# Patient Record
Sex: Male | Born: 1980 | Race: White | Marital: Married | Smoking: Never smoker
Health system: Southern US, Community
[De-identification: ages and names within clinical notes are randomized; demographics above are authoritative.]

---

## 2017-08-07 ENCOUNTER — Ambulatory Visit
Admission: RE | Admit: 2017-08-07 | Discharge: 2017-08-07 | Disposition: A | Discharge status: Home / Self Care | Payer: No Typology Code available for payment source | Source: Ambulatory Visit | Attending: Occupational Medicine | Admitting: Occupational Medicine

## 2017-08-07 ENCOUNTER — Other Ambulatory Visit: Payer: Self-pay | Admitting: Occupational Medicine

## 2017-08-07 DIAGNOSIS — Z021 Encounter for pre-employment examination: Secondary | ICD-10-CM

## 2018-11-26 ENCOUNTER — Ambulatory Visit (HOSPITAL_COMMUNITY)
Admission: EM | Admit: 2018-11-26 | Discharge: 2018-11-26 | Disposition: A | Discharge status: Home / Self Care | Payer: 59 | Attending: Family Medicine | Admitting: Family Medicine

## 2018-11-26 ENCOUNTER — Other Ambulatory Visit: Payer: Self-pay

## 2018-11-26 ENCOUNTER — Encounter (HOSPITAL_COMMUNITY): Payer: Self-pay | Admitting: Emergency Medicine

## 2018-11-26 DIAGNOSIS — J029 Acute pharyngitis, unspecified: Secondary | ICD-10-CM | POA: Diagnosis not present

## 2018-11-26 DIAGNOSIS — Z20828 Contact with and (suspected) exposure to other viral communicable diseases: Secondary | ICD-10-CM | POA: Diagnosis not present

## 2018-11-26 LAB — POCT RAPID STREP A: Streptococcus, Group A Screen (Direct): NEGATIVE

## 2018-11-26 NOTE — ED Triage Notes (Signed)
Pt here with sore throat x 3 days 

## 2018-11-26 NOTE — Discharge Instructions (Addendum)
Your strep test was negative.  We will send for culture. Not convinced this is COVID but we will send a COVID swab and call you if your results are positive Most likely this is a common cold or allergies.  You can try over-the-counter cetirizine or Zyrtec for your symptoms.  You could do some salt water gargles or throat lozenges for your sore throat Follow up as needed for continued or worsening symptoms

## 2018-11-26 NOTE — ED Provider Notes (Signed)
[facility]    CSN: [id number] Arrival date & time: 11/26/18  1027      History   Chief Complaint Chief Complaint  Patient presents with  . Sore Throat    HPI Jorge Cooper is a 38 y.o. male.   Patient is an otherwise healthy male presents today with approximately 3 days of sore throat.  Somewhat worsening this morning with pain with swallowing.  Denies any trouble swallowing.  Symptoms have been constant.  He has not taken anything for his symptoms.  Denies any cough, chest congestion, fever, ear pain, body aches, chills.  Patient is a Emergency planning/management officerpolice officer.  Denies any known COVID exposures.  ROS per HPI      History reviewed. No pertinent past medical history.  There are no active problems to display for this patient.   History reviewed. No pertinent surgical history.     Home Medications    Prior to Admission medications   Not on File    Family History History reviewed. No pertinent family history.  Social History Social History   Tobacco Use  . Smoking status: Never Smoker  . Smokeless tobacco: Never Used  Substance Use Topics  . Alcohol use: Not Currently  . Drug use: Never     Allergies   Patient has no known allergies.   Review of Systems Review of Systems   Physical Exam Triage Vital Signs ED Triage Vitals [11/26/18 1111]  Enc Vitals Group     BP 137/77     Pulse Rate 83     Resp 18     Temp 98.5 F (36.9 C)     Temp Source Oral     SpO2 100 %     Weight      Height      Head Circumference      Peak Flow      Pain Score 5     Pain Loc      Pain Edu?      Excl. in GC?    No data found.  Updated Vital Signs BP 137/77 (BP Location: Left Arm)   Pulse 83   Temp 98.5 F (36.9 C) (Oral)   Resp 18   SpO2 100%   Visual Acuity Right Eye Distance:   Left Eye Distance:   Bilateral Distance:    Right Eye Near:   Left Eye Near:    Bilateral Near:     Physical Exam Vitals signs and nursing note reviewed.   Constitutional:      General: He is not in acute distress.    Appearance: He is well-developed. He is not ill-appearing, toxic-appearing or diaphoretic.  HENT:     Head: Normocephalic and atraumatic.     Right Ear: Tympanic membrane and ear canal normal.     Left Ear: Tympanic membrane and ear canal normal.     Nose: Congestion and rhinorrhea present.     Mouth/Throat:     Pharynx: Oropharynx is clear. Uvula midline.     Tonsils: No tonsillar exudate or tonsillar abscesses. 0 on the right. 0 on the left.  Eyes:     Conjunctiva/sclera: Conjunctivae normal.  Neck:     Musculoskeletal: Normal range of motion.  Pulmonary:     Effort: Pulmonary effort is normal.  Lymphadenopathy:     Cervical: No cervical adenopathy.  Skin:    General: Skin is dry.  Neurological:     Mental Status: He is alert.  Psychiatric:  Mood and Affect: Mood normal.      UC Treatments / Results  Labs (all labs ordered are listed, but only abnormal results are displayed) Labs Reviewed  NOVEL CORONAVIRUS, NAA (HOSP ORDER, SEND-OUT TO REF LAB; TAT 18-24 HRS)  CULTURE, GROUP A STREP [facility])  POCT RAPID STREP A    EKG   Radiology No results found.  Procedures Procedures (including critical care time)  Medications Ordered in UC Medications - No data to display  Initial Impression / Assessment and Plan / UC Course  I have reviewed the triage vital signs and the nursing notes.  Pertinent labs & imaging results that were available during my care of the patient were reviewed by me and considered in my medical decision making (see chart for details).     Rapid strep test negative COVID testing done with labs pending Not highly suspicious.  More likely URI or allergy related. Recommend over-the-counter Zyrtec for symptoms Follow up as needed for continued or worsening symptoms  Final Clinical Impressions(s) / UC Diagnoses   Final diagnoses:  Sore throat     Discharge Instructions      Your strep test was negative.  We will send for culture. Not convinced this is COVID but we will send a COVID swab and call you if your results are positive Most likely this is a common cold or allergies.  You can try over-the-counter cetirizine or Zyrtec for your symptoms.  You could do some salt water gargles or throat lozenges for your sore throat Follow up as needed for continued or worsening symptoms     ED Prescriptions    None     Controlled Substance Prescriptions [place] Controlled Substance Registry consulted? Not Applicable   Orvan July, NP 11/26/18 1232

## 2018-11-27 LAB — NOVEL CORONAVIRUS, NAA (HOSP ORDER, SEND-OUT TO REF LAB; TAT 18-24 HRS): SARS-CoV-2, NAA: NOT DETECTED

## 2018-11-28 ENCOUNTER — Encounter (HOSPITAL_COMMUNITY): Payer: Self-pay

## 2018-11-28 LAB — CULTURE, GROUP A STREP (THRC)

## 2018-12-01 ENCOUNTER — Telehealth: Payer: 59 | Admitting: Family

## 2018-12-01 ENCOUNTER — Encounter: Payer: Self-pay | Admitting: Family

## 2018-12-01 DIAGNOSIS — J069 Acute upper respiratory infection, unspecified: Secondary | ICD-10-CM

## 2018-12-01 MED ORDER — FLUTICASONE PROPIONATE 50 MCG/ACT NA SUSP: 2.0000 | Freq: Every day | NASAL | 6 refills | Status: DC

## 2018-12-01 MED ORDER — PROMETHAZINE-DM 6.25-15 MG/5ML PO SYRP: 5.0000 mL | ORAL_SOLUTION | Freq: Four times a day (QID) | ORAL | 0 refills | Status: AC | PRN

## 2018-12-01 NOTE — Progress Notes (Signed)
We are sorry you are not feeling well.  Here is how we plan to help!  Based on what you have shared with me, it looks like you may have a viral upper respiratory infection.  Upper respiratory infections are caused by a large number of viruses; however, rhinovirus is the most common cause.   Symptoms vary from person to person, with common symptoms including sore throat, cough, fatigue or lack of energy and feeling of general discomfort.  A low-grade fever of up to 100.4 may present, but is often uncommon.  Symptoms vary however, and are closely related to a person's age or underlying illnesses.  The most common symptoms associated with an upper respiratory infection are nasal discharge or congestion, cough, sneezing, headache and pressure in the ears and face.  These symptoms usually persist for about 3 to 10 days, but can last up to 2 weeks.  It is important to know that upper respiratory infections do not cause serious illness or complications in most cases.    Upper respiratory infections can be transmitted from person to person, with the most common method of transmission being a person's hands.  The virus is able to live on the skin and can infect other persons for up to 2 hours after direct contact.  Also, these can be transmitted when someone coughs or sneezes; thus, it is important to cover the mouth to reduce this risk.  To keep the spread of the illness at bay, good hand hygiene is very important.  This is an infection that is most likely caused by a virus. There are no specific treatments other than to help you with the symptoms until the infection runs its course.  We are sorry you are not feeling well.  Here is how we plan to help!   For nasal congestion, you may use an oral decongestants such as Mucinex D or if you have glaucoma or high blood pressure use plain Mucinex.  Saline nasal spray or nasal drops can help and can safely be used as often as needed for congestion.  For your congestion,  I have prescribed Fluticasone nasal spray one spray in each nostril twice a day  If you do not have a history of heart disease, hypertension, diabetes or thyroid disease, prostate/bladder issues or glaucoma, you may also use Sudafed to treat nasal congestion.  It is highly recommended that you consult with a pharmacist or your primary care physician to ensure this medication is safe for you to take.     If you have a cough, you may use cough suppressants such as Delsym and Robitussin.  If you have glaucoma or high blood pressure, you can also use Coricidin HBP.   For cough I have prescribed for you a cough medication Promethazine DM. It has been sent to your pharmacy.  If you have a sore or scratchy throat, use a saltwater gargle-  to  teaspoon of salt dissolved in a 4-ounce to 8-ounce glass of warm water.  Gargle the solution for approximately 15-30 seconds and then spit.  It is important not to swallow the solution.  You can also use throat lozenges/cough drops and Chloraseptic spray to help with throat pain or discomfort.  Warm or cold liquids can also be helpful in relieving throat pain.  For headache, pain or general discomfort, you can use Ibuprofen or Tylenol as directed.   Some authorities believe that zinc sprays or the use of Echinacea may shorten the course of your symptoms.  HOME CARE . Only take medications as instructed by your medical team. . Be sure to drink plenty of fluids. Water is fine as well as fruit juices, sodas and electrolyte beverages. You may want to stay away from caffeine or alcohol. If you are nauseated, try taking small sips of liquids. How do you know if you are getting enough fluid? Your urine should be a pale yellow or almost colorless. . Get rest. . Taking a steamy shower or using a humidifier may help nasal congestion and ease sore throat pain. You can place a towel over your head and breathe in the steam from hot water coming from a faucet. . Using a saline  nasal spray works much the same way. . Cough drops, hard candies and sore throat lozenges may ease your cough. . Avoid close contacts especially the very young and the elderly . Cover your mouth if you cough or sneeze . Always remember to wash your hands.   GET HELP RIGHT AWAY IF: . You develop worsening fever. . If your symptoms do not improve within 10 days . You develop yellow or green discharge from your nose over 3 days. . You have coughing fits . You develop a severe head ache or visual changes. . You develop shortness of breath, difficulty breathing or start having chest pain . Your symptoms persist after you have completed your treatment plan  MAKE SURE YOU   Understand these instructions.  Will watch your condition.  Will get help right away if you are not doing well or get worse.  Your e-visit answers were reviewed by a board certified advanced clinical practitioner to complete your personal care plan. Depending upon the condition, your plan could have included both over the counter or prescription medications. Please review your pharmacy choice. If there is a problem, you may call our nursing hot line at and have the prescription routed to another pharmacy. Your safety is important to [place]. If you have drug allergies check your prescription carefully.   You can use MyChart to ask questions about today's visit, request a non-urgent call back, or ask for a work or school excuse for 24 hours related to this e-Visit. If it has been greater than 24 hours you will need to follow up with your provider, or enter a new e-Visit to address those concerns. You will get an e-mail in the next two days asking about your experience.  I hope that your e-visit has been valuable and will speed your recovery. Thank you for using e-visits.     Greater than 5 minutes, yet less than 10 minutes of time have been spent researching, coordinating, and implementing care for this patient today.  Thank  you for the details you included in the comment boxes. Those details are very helpful in determining the best course of treatment for you and help [place] to provide the best care.

## 2019-04-15 IMAGING — CR DG CHEST 1V
1 series · 1 of 1 positions shown · non-contrast
Comparison: None.

CLINICAL DATA: Pre-employment physical examination

EXAM:
CHEST  1 VIEW

[w chest pa]
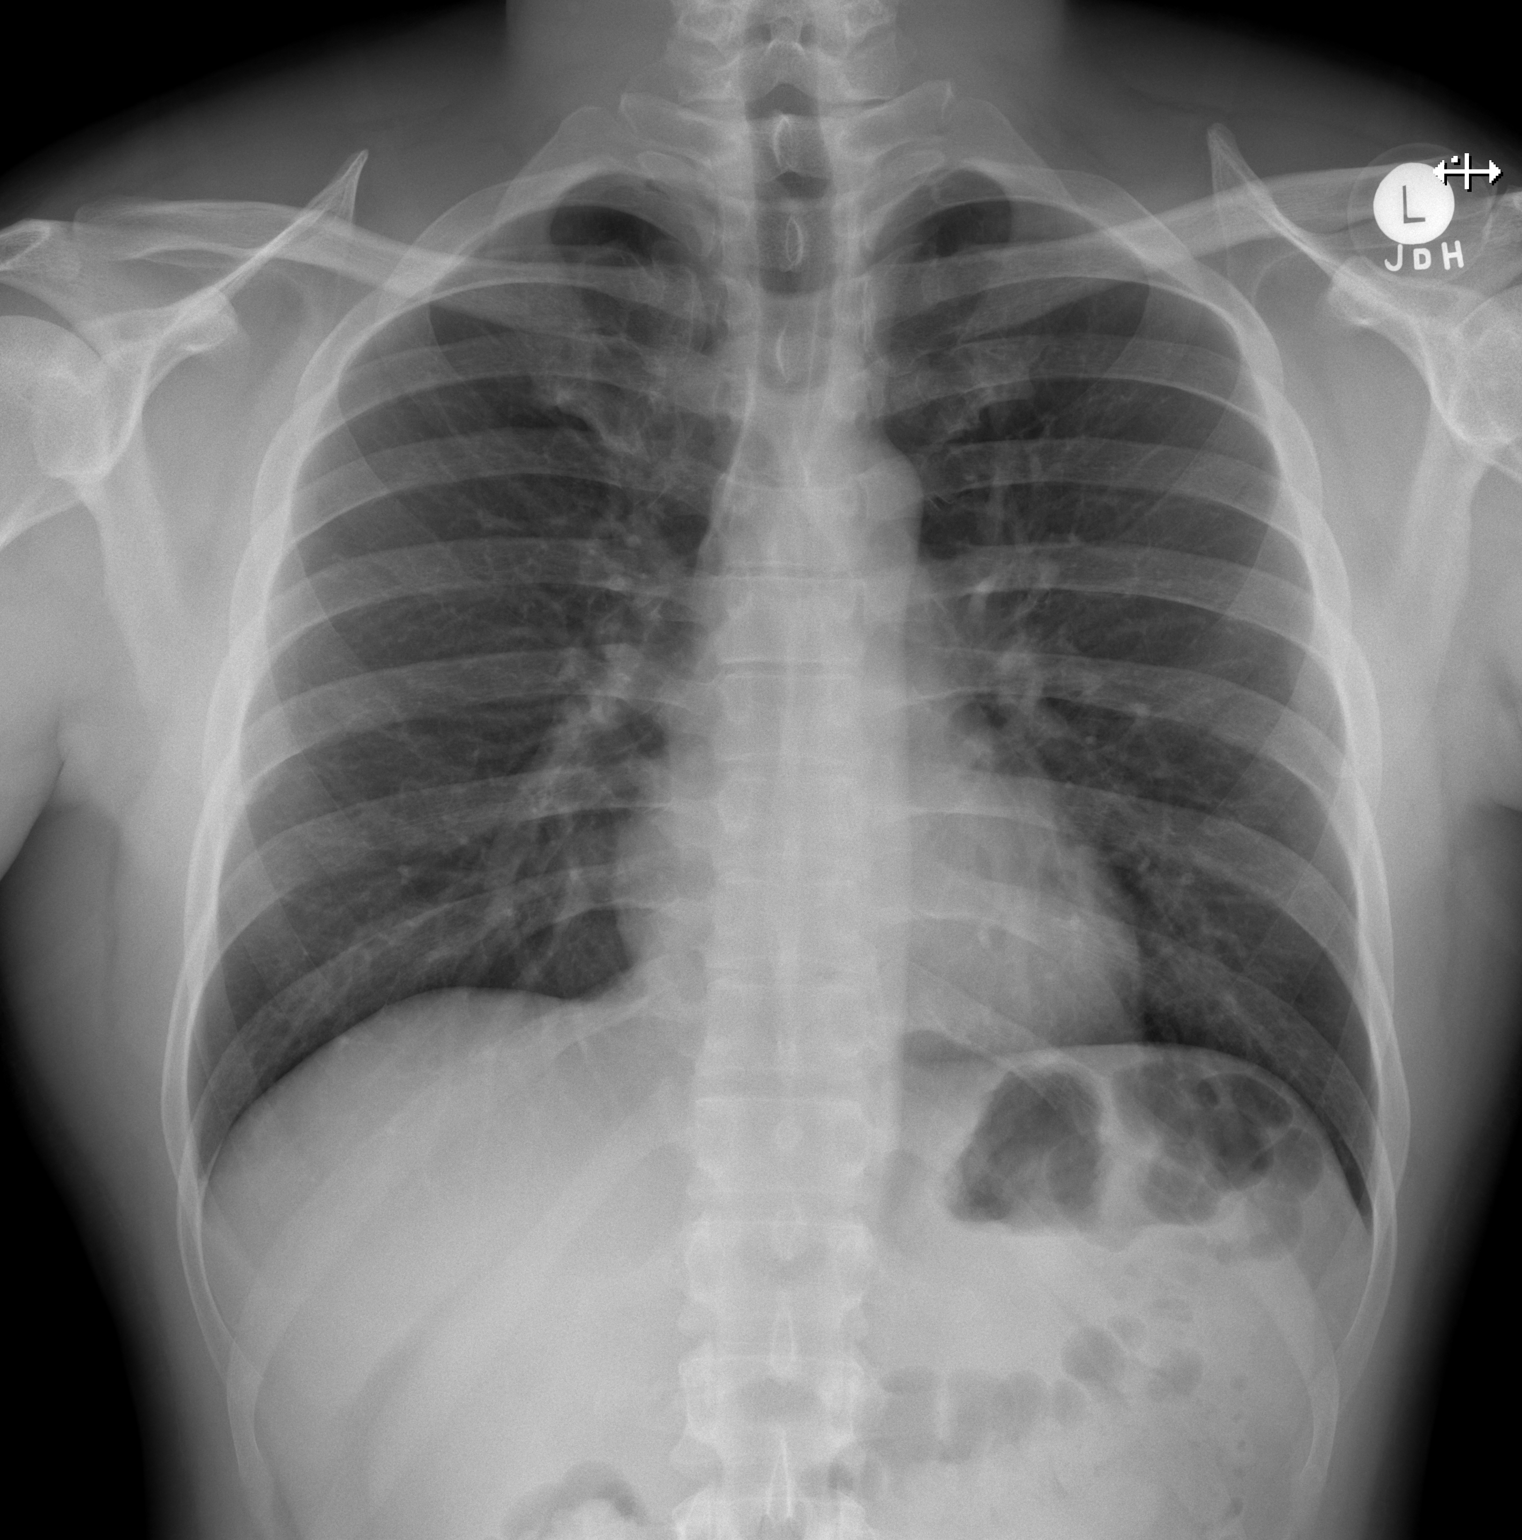

[1 of 1 positions shown; findings below may reference images not displayed]

FINDINGS: Lungs are clear. Heart size and pulmonary vascularity are normal. No
adenopathy. No pneumothorax. No bone lesions.
IMPRESSION: No edema or consolidation.

## 2019-09-24 ENCOUNTER — Telehealth: Payer: 59 | Admitting: Emergency Medicine

## 2019-09-24 DIAGNOSIS — J069 Acute upper respiratory infection, unspecified: Secondary | ICD-10-CM | POA: Diagnosis not present

## 2019-09-24 MED ORDER — FLUTICASONE PROPIONATE 50 MCG/ACT NA SUSP: 2.0000 | Freq: Every day | NASAL | 0 refills | Status: AC

## 2019-09-24 NOTE — Progress Notes (Signed)
We are sorry you are not feeling well.  Here is how we plan to help!  Based on what you have shared with me, it looks like you may have a viral upper respiratory infection.  Upper respiratory infections are caused by a large number of viruses; however, rhinovirus is the most common cause.   Symptoms vary from person to person, with common symptoms including sore throat, cough, fatigue or lack of energy and feeling of general discomfort.  A low-grade fever of up to 100.4 may present, but is often uncommon.  Symptoms vary however, and are closely related to a person's age or underlying illnesses.  The most common symptoms associated with an upper respiratory infection are nasal discharge or congestion, cough, sneezing, headache and pressure in the ears and face.  These symptoms usually persist for about 3 to 10 days, but can last up to 2 weeks.  It is important to know that upper respiratory infections do not cause serious illness or complications in most cases.    Upper respiratory infections can be transmitted from person to person, with the most common method of transmission being a person's hands.  The virus is able to live on the skin and can infect other persons for up to 2 hours after direct contact.  Also, these can be transmitted when someone coughs or sneezes; thus, it is important to cover the mouth to reduce this risk.  To keep the spread of the illness at bay, good hand hygiene is very important.  This is an infection that is most likely caused by a virus. There are no specific treatments other than to help you with the symptoms until the infection runs its course.  We are sorry you are not feeling well.  Here is how we plan to help!   For nasal congestion, you may use an oral decongestants such as Mucinex D or if you have glaucoma or high blood pressure use plain Mucinex.  Saline nasal spray or nasal drops can help and can safely be used as often as needed for congestion.  For your congestion,  I have prescribed Fluticasone nasal spray one spray in each nostril twice a day   The sore throat is generally caused by post-nasal drip.  Using the nose spray should slow down the post-nasal drip and your sore throat symptoms should improve.  If you do not have a history of heart disease, hypertension, diabetes or thyroid disease, prostate/bladder issues or glaucoma, you may also use Sudafed to treat nasal congestion.  It is highly recommended that you consult with a pharmacist or your primary care physician to ensure this medication is safe for you to take.     If you have a cough, you may use cough suppressants such as Delsym and Robitussin.  If you have glaucoma or high blood pressure, you can also use Coricidin HBP.    If you have a sore or scratchy throat, use a saltwater gargle-  to  teaspoon of salt dissolved in a 4-ounce to 8-ounce glass of warm water.  Gargle the solution for approximately 15-30 seconds and then spit.  It is important not to swallow the solution.  You can also use throat lozenges/cough drops and Chloraseptic spray to help with throat pain or discomfort.  Warm or cold liquids can also be helpful in relieving throat pain.  For headache, pain or general discomfort, you can use Ibuprofen or Tylenol as directed.   Some authorities believe that zinc sprays or the use  of Echinacea may shorten the course of your symptoms.   HOME CARE . Only take medications as instructed by your medical team. . Be sure to drink plenty of fluids. Water is fine as well as fruit juices, sodas and electrolyte beverages. You may want to stay away from caffeine or alcohol. If you are nauseated, try taking small sips of liquids. How do you know if you are getting enough fluid? Your urine should be a pale yellow or almost colorless. . Get rest. . Taking a steamy shower or using a humidifier may help nasal congestion and ease sore throat pain. You can place a towel over your head and breathe in the  steam from hot water coming from a faucet. . Using a saline nasal spray works much the same way. . Cough drops, hard candies and sore throat lozenges may ease your cough. . Avoid close contacts especially the very young and the elderly . Cover your mouth if you cough or sneeze . Always remember to wash your hands.   GET HELP RIGHT AWAY IF: . You develop worsening fever. . If your symptoms do not improve within 10 days . You develop yellow or green discharge from your nose over 3 days. . You have coughing fits . You develop a severe head ache or visual changes. . You develop shortness of breath, difficulty breathing or start having chest pain . Your symptoms persist after you have completed your treatment plan  MAKE SURE YOU   Understand these instructions.  Will watch your condition.  Will get help right away if you are not doing well or get worse.  Your e-visit answers were reviewed by a board certified advanced clinical practitioner to complete your personal care plan. Depending upon the condition, your plan could have included both over the counter or prescription medications. Please review your pharmacy choice. If there is a problem, you may call our nursing hot line at and have the prescription routed to another pharmacy. Your safety is important to [place]. If you have drug allergies check your prescription carefully.   You can use MyChart to ask questions about today's visit, request a non-urgent call back, or ask for a work or school excuse for 24 hours related to this e-Visit. If it has been greater than 24 hours you will need to follow up with your provider, or enter a new e-Visit to address those concerns. You will get an e-mail in the next two days asking about your experience.  I hope that your e-visit has been valuable and will speed your recovery. Thank you for using e-visits.     Approximately 5 minutes was used in reviewing the patient's chart, questionnaire, prescribing  medications, and documentation.

## 2023-03-29 DIAGNOSIS — Z419 Encounter for procedure for purposes other than remedying health state, unspecified: Secondary | ICD-10-CM | POA: Diagnosis not present

## 2023-04-29 DIAGNOSIS — Z419 Encounter for procedure for purposes other than remedying health state, unspecified: Secondary | ICD-10-CM | POA: Diagnosis not present

## 2023-05-27 DIAGNOSIS — Z419 Encounter for procedure for purposes other than remedying health state, unspecified: Secondary | ICD-10-CM | POA: Diagnosis not present

## 2023-07-08 DIAGNOSIS — Z419 Encounter for procedure for purposes other than remedying health state, unspecified: Secondary | ICD-10-CM | POA: Diagnosis not present

## 2023-08-07 DIAGNOSIS — Z419 Encounter for procedure for purposes other than remedying health state, unspecified: Secondary | ICD-10-CM | POA: Diagnosis not present

## 2023-09-07 DIAGNOSIS — Z419 Encounter for procedure for purposes other than remedying health state, unspecified: Secondary | ICD-10-CM | POA: Diagnosis not present

## 2023-10-07 DIAGNOSIS — Z419 Encounter for procedure for purposes other than remedying health state, unspecified: Secondary | ICD-10-CM | POA: Diagnosis not present

## 2023-11-07 DIAGNOSIS — Z419 Encounter for procedure for purposes other than remedying health state, unspecified: Secondary | ICD-10-CM | POA: Diagnosis not present

## 2023-12-08 DIAGNOSIS — Z419 Encounter for procedure for purposes other than remedying health state, unspecified: Secondary | ICD-10-CM | POA: Diagnosis not present

## 2024-01-09 DIAGNOSIS — R9431 Abnormal electrocardiogram [ECG] [EKG]: Secondary | ICD-10-CM | POA: Diagnosis not present

## 2024-01-09 DIAGNOSIS — R000 Tachycardia, unspecified: Secondary | ICD-10-CM | POA: Diagnosis not present

## 2024-01-09 DIAGNOSIS — R531 Weakness: Secondary | ICD-10-CM | POA: Diagnosis not present

## 2024-01-09 DIAGNOSIS — R202 Paresthesia of skin: Secondary | ICD-10-CM | POA: Diagnosis not present

## 2024-01-09 DIAGNOSIS — R42 Dizziness and giddiness: Secondary | ICD-10-CM | POA: Diagnosis not present

## 2024-01-09 DIAGNOSIS — R55 Syncope and collapse: Secondary | ICD-10-CM | POA: Diagnosis not present

## 2024-01-09 DIAGNOSIS — R072 Precordial pain: Secondary | ICD-10-CM | POA: Diagnosis not present

## 2024-01-15 DIAGNOSIS — R7309 Other abnormal glucose: Secondary | ICD-10-CM | POA: Diagnosis not present

## 2024-01-15 DIAGNOSIS — R55 Syncope and collapse: Secondary | ICD-10-CM | POA: Diagnosis not present

## 2024-01-24 DIAGNOSIS — R079 Chest pain, unspecified: Secondary | ICD-10-CM | POA: Diagnosis not present

## 2024-01-24 DIAGNOSIS — R55 Syncope and collapse: Secondary | ICD-10-CM | POA: Diagnosis not present

## 2024-01-29 DIAGNOSIS — R55 Syncope and collapse: Secondary | ICD-10-CM | POA: Diagnosis not present

## 2024-02-19 DIAGNOSIS — R55 Syncope and collapse: Secondary | ICD-10-CM | POA: Diagnosis not present

## 2024-02-19 DIAGNOSIS — R232 Flushing: Secondary | ICD-10-CM | POA: Diagnosis not present

## 2024-02-21 DIAGNOSIS — R232 Flushing: Secondary | ICD-10-CM | POA: Diagnosis not present

## 2024-02-21 DIAGNOSIS — R55 Syncope and collapse: Secondary | ICD-10-CM | POA: Diagnosis not present

## 2024-03-02 DIAGNOSIS — R110 Nausea: Secondary | ICD-10-CM | POA: Diagnosis not present

## 2024-03-02 DIAGNOSIS — R002 Palpitations: Secondary | ICD-10-CM | POA: Diagnosis not present

## 2024-03-02 DIAGNOSIS — R0600 Dyspnea, unspecified: Secondary | ICD-10-CM | POA: Diagnosis not present

## 2024-03-02 DIAGNOSIS — R42 Dizziness and giddiness: Secondary | ICD-10-CM | POA: Diagnosis not present

## 2024-03-02 DIAGNOSIS — R55 Syncope and collapse: Secondary | ICD-10-CM | POA: Diagnosis not present

## 2024-03-02 DIAGNOSIS — R0602 Shortness of breath: Secondary | ICD-10-CM | POA: Diagnosis not present

## 2024-03-02 DIAGNOSIS — R61 Generalized hyperhidrosis: Secondary | ICD-10-CM | POA: Diagnosis not present

## 2024-03-04 DIAGNOSIS — F419 Anxiety disorder, unspecified: Secondary | ICD-10-CM | POA: Diagnosis not present

## 2024-03-05 DIAGNOSIS — R55 Syncope and collapse: Secondary | ICD-10-CM | POA: Diagnosis not present
